# Patient Record
Sex: Male | Born: 1975 | Race: White | Hispanic: No | Marital: Single | Smoking: Current every day smoker
Health system: Southern US, Community
[De-identification: ages and names within clinical notes are randomized; demographics above are authoritative.]

## PROBLEM LIST (undated history)

## (undated) DIAGNOSIS — H6690 Otitis media, unspecified, unspecified ear: Secondary | ICD-10-CM

---

## 2002-11-29 ENCOUNTER — Emergency Department (HOSPITAL_COMMUNITY): Admission: EM | Admit: 2002-11-29 | Discharge: 2002-11-29 | Payer: Self-pay

## 2002-12-08 ENCOUNTER — Emergency Department (HOSPITAL_COMMUNITY): Admission: EM | Admit: 2002-12-08 | Discharge: 2002-12-08 | Payer: Self-pay | Admitting: Emergency Medicine

## 2003-01-05 ENCOUNTER — Emergency Department (HOSPITAL_COMMUNITY): Admission: EM | Admit: 2003-01-05 | Discharge: 2003-01-06 | Payer: Self-pay | Admitting: Emergency Medicine

## 2003-01-21 ENCOUNTER — Emergency Department (HOSPITAL_COMMUNITY): Admission: EM | Admit: 2003-01-21 | Discharge: 2003-01-21 | Payer: Self-pay | Admitting: Emergency Medicine

## 2003-01-21 ENCOUNTER — Encounter: Payer: Self-pay | Admitting: Emergency Medicine

## 2003-02-08 ENCOUNTER — Emergency Department (HOSPITAL_COMMUNITY): Admission: EM | Admit: 2003-02-08 | Discharge: 2003-02-09 | Payer: Self-pay | Admitting: Emergency Medicine

## 2003-02-11 ENCOUNTER — Emergency Department (HOSPITAL_COMMUNITY): Admission: EM | Admit: 2003-02-11 | Discharge: 2003-02-11 | Payer: Self-pay | Admitting: Emergency Medicine

## 2003-02-22 ENCOUNTER — Emergency Department (HOSPITAL_COMMUNITY): Admission: EM | Admit: 2003-02-22 | Discharge: 2003-02-22 | Payer: Self-pay | Admitting: Emergency Medicine

## 2003-03-23 ENCOUNTER — Emergency Department (HOSPITAL_COMMUNITY): Admission: EM | Admit: 2003-03-23 | Discharge: 2003-03-23 | Payer: Self-pay | Admitting: *Deleted

## 2003-03-23 ENCOUNTER — Encounter: Payer: Self-pay | Admitting: *Deleted

## 2003-04-06 ENCOUNTER — Encounter: Payer: Self-pay | Admitting: Emergency Medicine

## 2003-04-06 ENCOUNTER — Emergency Department (HOSPITAL_COMMUNITY): Admission: EM | Admit: 2003-04-06 | Discharge: 2003-04-07 | Payer: Self-pay | Admitting: Emergency Medicine

## 2003-04-15 ENCOUNTER — Emergency Department (HOSPITAL_COMMUNITY): Admission: EM | Admit: 2003-04-15 | Discharge: 2003-04-15 | Payer: Self-pay | Admitting: Emergency Medicine

## 2003-05-07 ENCOUNTER — Emergency Department (HOSPITAL_COMMUNITY): Admission: EM | Admit: 2003-05-07 | Discharge: 2003-05-08 | Payer: Self-pay | Admitting: Emergency Medicine

## 2003-05-31 ENCOUNTER — Emergency Department (HOSPITAL_COMMUNITY): Admission: EM | Admit: 2003-05-31 | Discharge: 2003-06-01 | Payer: Self-pay | Admitting: Emergency Medicine

## 2003-06-05 ENCOUNTER — Emergency Department (HOSPITAL_COMMUNITY): Admission: EM | Admit: 2003-06-05 | Discharge: 2003-06-05 | Payer: Self-pay | Admitting: Emergency Medicine

## 2003-06-07 ENCOUNTER — Emergency Department (HOSPITAL_COMMUNITY): Admission: EM | Admit: 2003-06-07 | Discharge: 2003-06-07 | Payer: Self-pay | Admitting: Emergency Medicine

## 2003-06-09 ENCOUNTER — Emergency Department (HOSPITAL_COMMUNITY): Admission: EM | Admit: 2003-06-09 | Discharge: 2003-06-09 | Payer: Self-pay | Admitting: Emergency Medicine

## 2003-06-10 ENCOUNTER — Emergency Department (HOSPITAL_COMMUNITY): Admission: EM | Admit: 2003-06-10 | Discharge: 2003-06-11 | Payer: Self-pay | Admitting: Emergency Medicine

## 2003-07-05 ENCOUNTER — Emergency Department (HOSPITAL_COMMUNITY): Admission: EM | Admit: 2003-07-05 | Discharge: 2003-07-05 | Payer: Self-pay | Admitting: *Deleted

## 2003-08-16 ENCOUNTER — Emergency Department (HOSPITAL_COMMUNITY): Admission: EM | Admit: 2003-08-16 | Discharge: 2003-08-16 | Payer: Self-pay | Admitting: Emergency Medicine

## 2003-09-04 ENCOUNTER — Emergency Department (HOSPITAL_COMMUNITY): Admission: EM | Admit: 2003-09-04 | Discharge: 2003-09-04 | Payer: Self-pay | Admitting: Emergency Medicine

## 2003-09-29 ENCOUNTER — Emergency Department (HOSPITAL_COMMUNITY): Admission: EM | Admit: 2003-09-29 | Discharge: 2003-09-29 | Payer: Self-pay | Admitting: Emergency Medicine

## 2003-10-07 ENCOUNTER — Emergency Department (HOSPITAL_COMMUNITY): Admission: EM | Admit: 2003-10-07 | Discharge: 2003-10-07 | Payer: Self-pay | Admitting: Emergency Medicine

## 2003-12-08 ENCOUNTER — Emergency Department (HOSPITAL_COMMUNITY): Admission: EM | Admit: 2003-12-08 | Discharge: 2003-12-08 | Payer: Self-pay | Admitting: Emergency Medicine

## 2004-02-06 ENCOUNTER — Emergency Department (HOSPITAL_COMMUNITY): Admission: EM | Admit: 2004-02-06 | Discharge: 2004-02-06 | Payer: Self-pay | Admitting: Emergency Medicine

## 2004-02-29 ENCOUNTER — Emergency Department (HOSPITAL_COMMUNITY): Admission: EM | Admit: 2004-02-29 | Discharge: 2004-03-01 | Payer: Self-pay | Admitting: Emergency Medicine

## 2004-03-01 ENCOUNTER — Emergency Department (HOSPITAL_COMMUNITY): Admission: EM | Admit: 2004-03-01 | Discharge: 2004-03-02 | Payer: Self-pay | Admitting: Emergency Medicine

## 2004-03-03 ENCOUNTER — Emergency Department (HOSPITAL_COMMUNITY): Admission: EM | Admit: 2004-03-03 | Discharge: 2004-03-04 | Payer: Self-pay | Admitting: *Deleted

## 2004-05-13 ENCOUNTER — Emergency Department (HOSPITAL_COMMUNITY): Admission: EM | Admit: 2004-05-13 | Discharge: 2004-05-14 | Payer: Self-pay | Admitting: Emergency Medicine

## 2004-10-20 ENCOUNTER — Emergency Department (HOSPITAL_COMMUNITY): Admission: EM | Admit: 2004-10-20 | Discharge: 2004-10-20 | Payer: Self-pay | Admitting: Emergency Medicine

## 2004-10-26 ENCOUNTER — Emergency Department (HOSPITAL_COMMUNITY): Admission: EM | Admit: 2004-10-26 | Discharge: 2004-10-26 | Payer: Self-pay | Admitting: Emergency Medicine

## 2004-12-04 ENCOUNTER — Emergency Department (HOSPITAL_COMMUNITY): Admission: EM | Admit: 2004-12-04 | Discharge: 2004-12-04 | Payer: Self-pay | Admitting: Emergency Medicine

## 2004-12-15 ENCOUNTER — Emergency Department (HOSPITAL_COMMUNITY): Admission: EM | Admit: 2004-12-15 | Discharge: 2004-12-15 | Payer: Self-pay | Admitting: Emergency Medicine

## 2005-04-21 ENCOUNTER — Emergency Department (HOSPITAL_COMMUNITY): Admission: EM | Admit: 2005-04-21 | Discharge: 2005-04-22 | Payer: Self-pay | Admitting: Emergency Medicine

## 2005-09-06 ENCOUNTER — Emergency Department (HOSPITAL_COMMUNITY): Admission: EM | Admit: 2005-09-06 | Discharge: 2005-09-06 | Payer: Self-pay | Admitting: Emergency Medicine

## 2005-09-11 ENCOUNTER — Emergency Department (HOSPITAL_COMMUNITY): Admission: EM | Admit: 2005-09-11 | Discharge: 2005-09-12 | Payer: Self-pay | Admitting: Emergency Medicine

## 2005-09-22 ENCOUNTER — Emergency Department (HOSPITAL_COMMUNITY): Admission: EM | Admit: 2005-09-22 | Discharge: 2005-09-22 | Payer: Self-pay | Admitting: Emergency Medicine

## 2005-10-16 ENCOUNTER — Emergency Department (HOSPITAL_COMMUNITY): Admission: EM | Admit: 2005-10-16 | Discharge: 2005-10-16 | Payer: Self-pay | Admitting: Emergency Medicine

## 2005-10-24 ENCOUNTER — Emergency Department: Payer: Self-pay | Admitting: Emergency Medicine

## 2005-10-29 ENCOUNTER — Emergency Department: Payer: Self-pay | Admitting: Emergency Medicine

## 2005-11-02 ENCOUNTER — Emergency Department (HOSPITAL_COMMUNITY): Admission: EM | Admit: 2005-11-02 | Discharge: 2005-11-02 | Payer: Self-pay | Admitting: Emergency Medicine

## 2005-11-09 ENCOUNTER — Emergency Department (HOSPITAL_COMMUNITY): Admission: EM | Admit: 2005-11-09 | Discharge: 2005-11-10 | Payer: Self-pay | Admitting: Emergency Medicine

## 2005-11-10 ENCOUNTER — Emergency Department: Payer: Self-pay | Admitting: Emergency Medicine

## 2005-11-29 ENCOUNTER — Emergency Department: Payer: Self-pay | Admitting: Emergency Medicine

## 2005-12-10 ENCOUNTER — Emergency Department: Payer: Self-pay | Admitting: Emergency Medicine

## 2007-06-25 IMAGING — CT CT ABDOMEN W/O CM
1 of 3 series · 15 of 36 positions shown, 19 images · non-contrast
Comparison: 09/22/2005.

CLINICAL DATA: 29-year-old, right flank pain. 
 ABDOMEN CT WITHOUT CONTRAST (CT UROGRAM):
TECHNIQUE: Multidetector CT imaging of the abdomen was performed following the standard protocol without IV contrast.
TECHNIQUE: Multidetector CT imaging of the pelvis was performed following the standard protocol without IV contrast. 
 No ureteral calculi or bladder calculi.  The rectum, sigmoid colon and visualized small bowel loops appear normal.  The appendix is visualized and is normal.  No significant bony findings.

[Series 2: renal stone · axial · 0.70mm/px · z∈[-444,-19]mm · 15 of 94 slices shown, 19 images]
[im 7/94  soft-tissue]
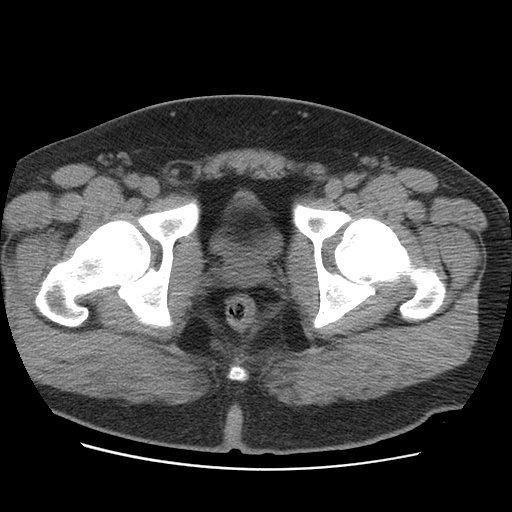
[im 7/94  bone]
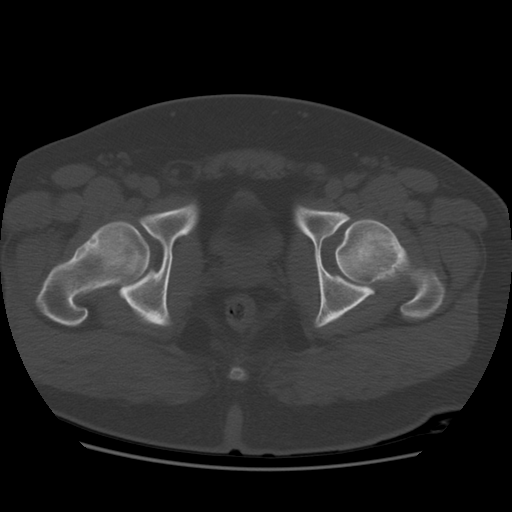
[im 13/94  soft-tissue]
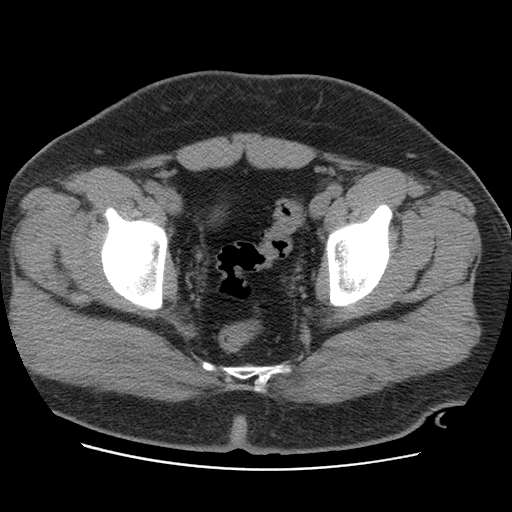
[im 19/94  soft-tissue]
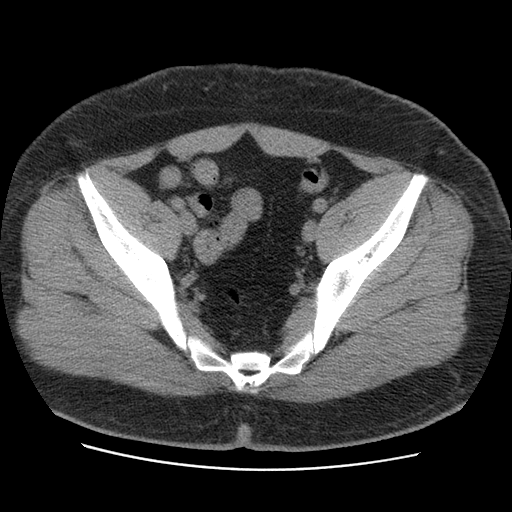
[im 28/94  soft-tissue]
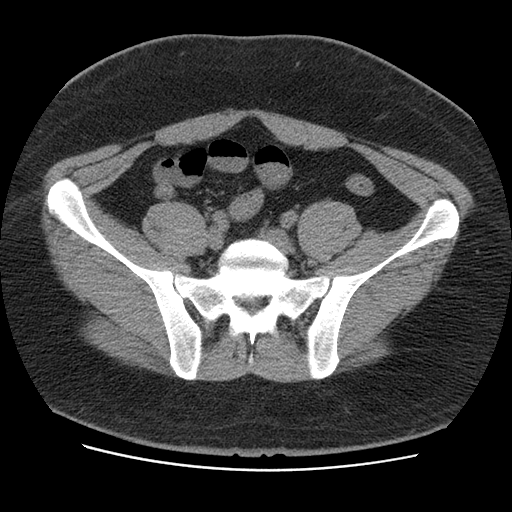
[im 34/94  soft-tissue]
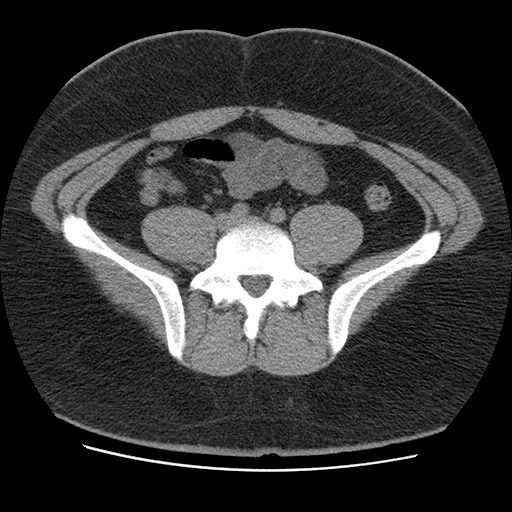
[im 40/94  soft-tissue]
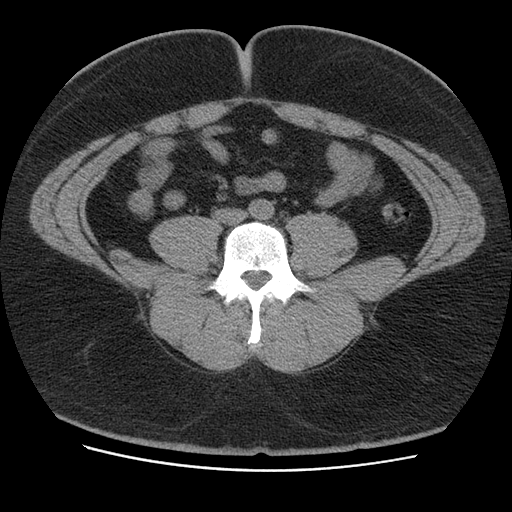
[im 49/94  soft-tissue]
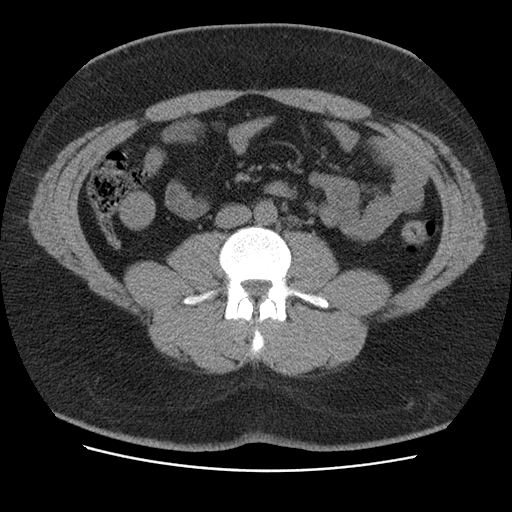
[im 55/94  soft-tissue]
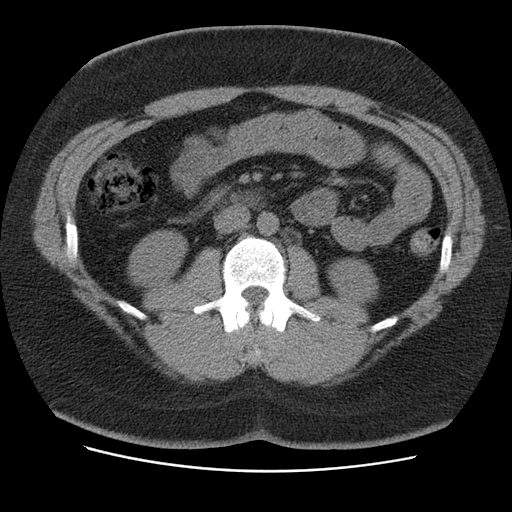
[im 61/94  soft-tissue]
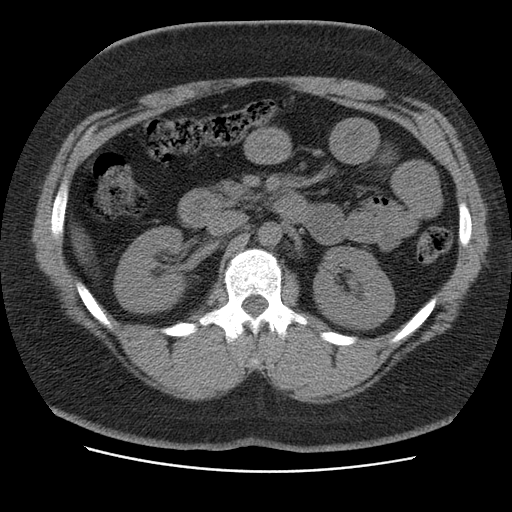
[im 61/94  bone]
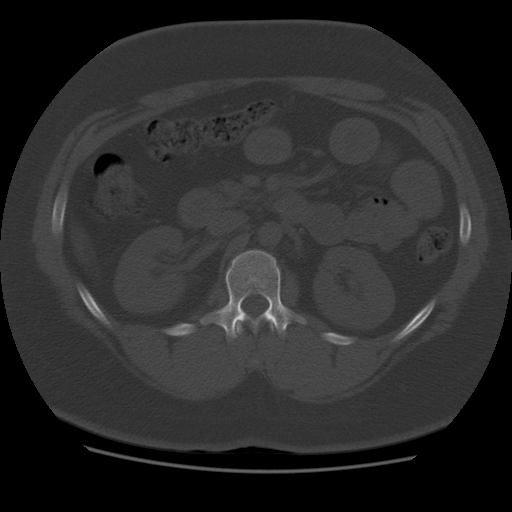
[im 67/94  soft-tissue]
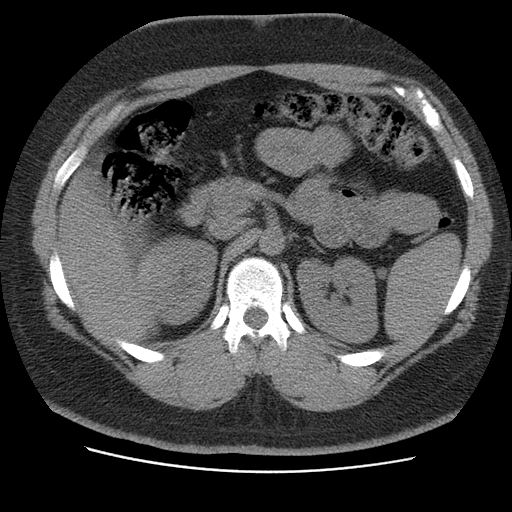
[im 76/94  soft-tissue]
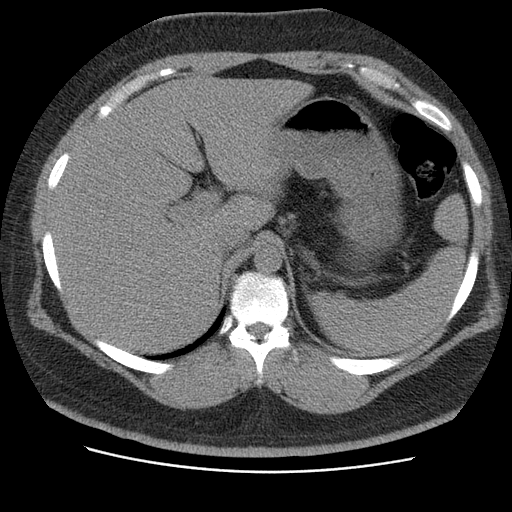
[im 82/94  soft-tissue]
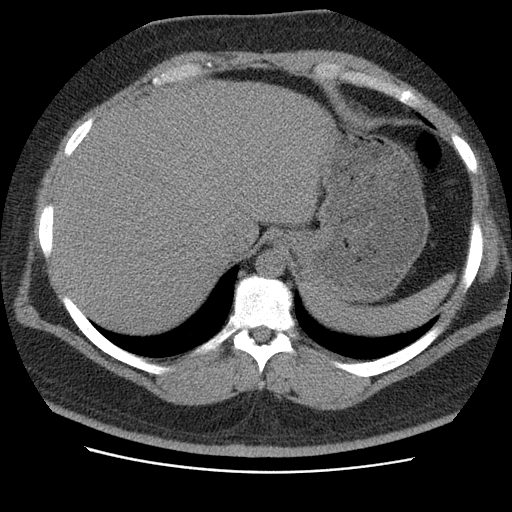
[im 82/94  lung]
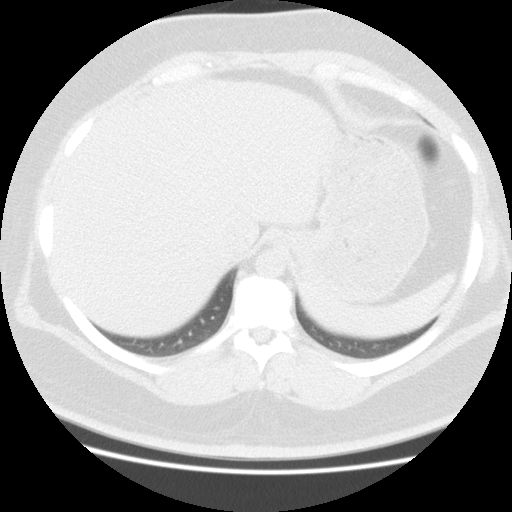
[im 85/94  lung]
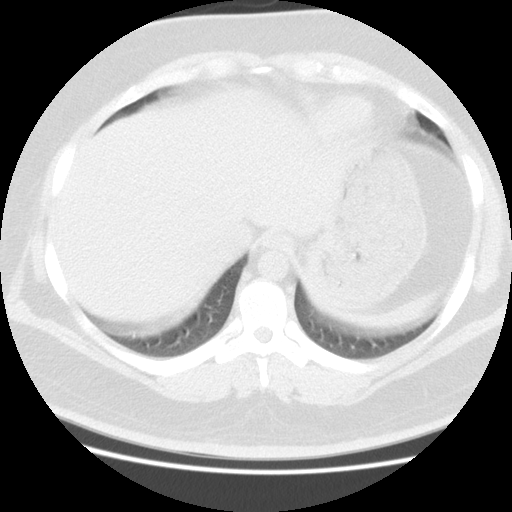
[im 88/94  soft-tissue]
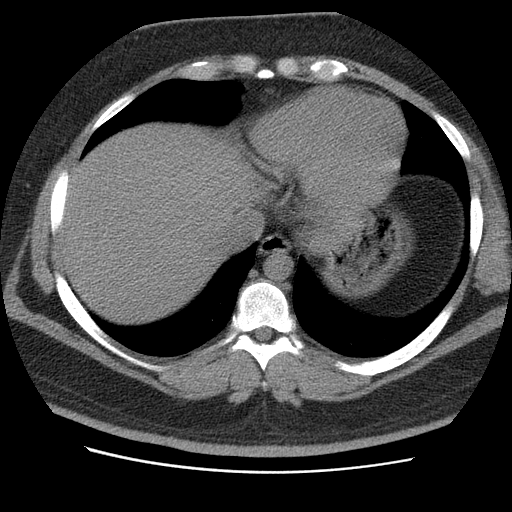
[im 88/94  lung]
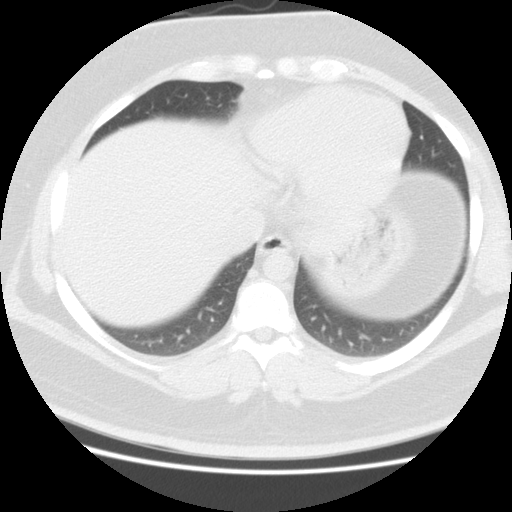
[im 91/94  lung]
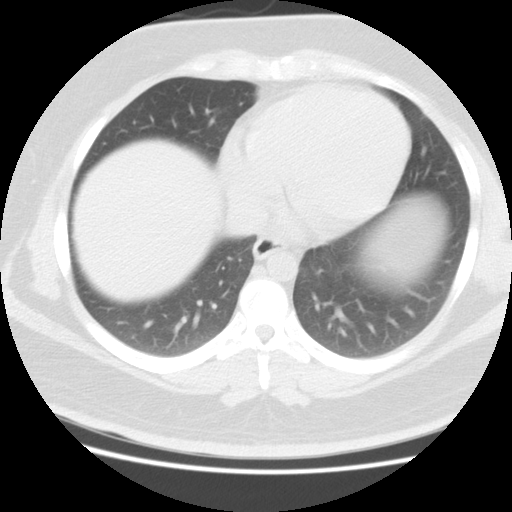

[15 of 36 positions shown; findings below may reference images not displayed]

The lung bases are clear.  
 The unenhanced appearance of the liver and spleen are unremarkable.  Small accessory spleens are again noted.  The pancreas, adrenal glands, and left kidney are normal in appearance.  Right kidney has a single right renal calculus which is stable.  No hydronephrosis or perinephric signs of obstruction.  The right ureter is normal in caliber.  No upper ureteral calculi are seen.  The aorta is normal in caliber.  There are a few small scattered stable fatty retroperitoneal lymph nodes.  Gallbladder is unremarkable.  Stomach, duodenum, small bowel and colon are grossly normal but the study is limited without oral contrast.
IMPRESSION: Stable right renal calculus. No evidence for renal obstructive process or ureteral calculi. 
 PELVIS CT WITHOUT CONTRAST:
IMPRESSION: Unremarkable CT examination of the pelvis.  No ureteral or bladder calculi.

## 2008-01-29 ENCOUNTER — Emergency Department (HOSPITAL_COMMUNITY): Admission: EM | Admit: 2008-01-29 | Discharge: 2008-01-29 | Payer: Self-pay | Admitting: Emergency Medicine

## 2008-02-09 ENCOUNTER — Emergency Department (HOSPITAL_COMMUNITY): Admission: EM | Admit: 2008-02-09 | Discharge: 2008-02-09 | Payer: Self-pay | Admitting: Emergency Medicine

## 2008-03-27 ENCOUNTER — Emergency Department (HOSPITAL_COMMUNITY): Admission: EM | Admit: 2008-03-27 | Discharge: 2008-03-27 | Payer: Self-pay | Admitting: Emergency Medicine

## 2008-04-24 ENCOUNTER — Emergency Department (HOSPITAL_COMMUNITY): Admission: EM | Admit: 2008-04-24 | Discharge: 2008-04-24 | Payer: Self-pay | Admitting: Emergency Medicine

## 2008-06-25 ENCOUNTER — Emergency Department (HOSPITAL_COMMUNITY): Admission: EM | Admit: 2008-06-25 | Discharge: 2008-06-25 | Payer: Self-pay | Admitting: Emergency Medicine

## 2009-10-01 IMAGING — CR DG KNEE COMPLETE 4+V*R*
4 series · 4 of 4 positions shown · non-contrast
Comparison: No priors

CLINICAL DATA: Injury August 2007.  Continued pain and swelling

RIGHT KNEE - COMPLETE 4+ VIEW

[t knee ap right]
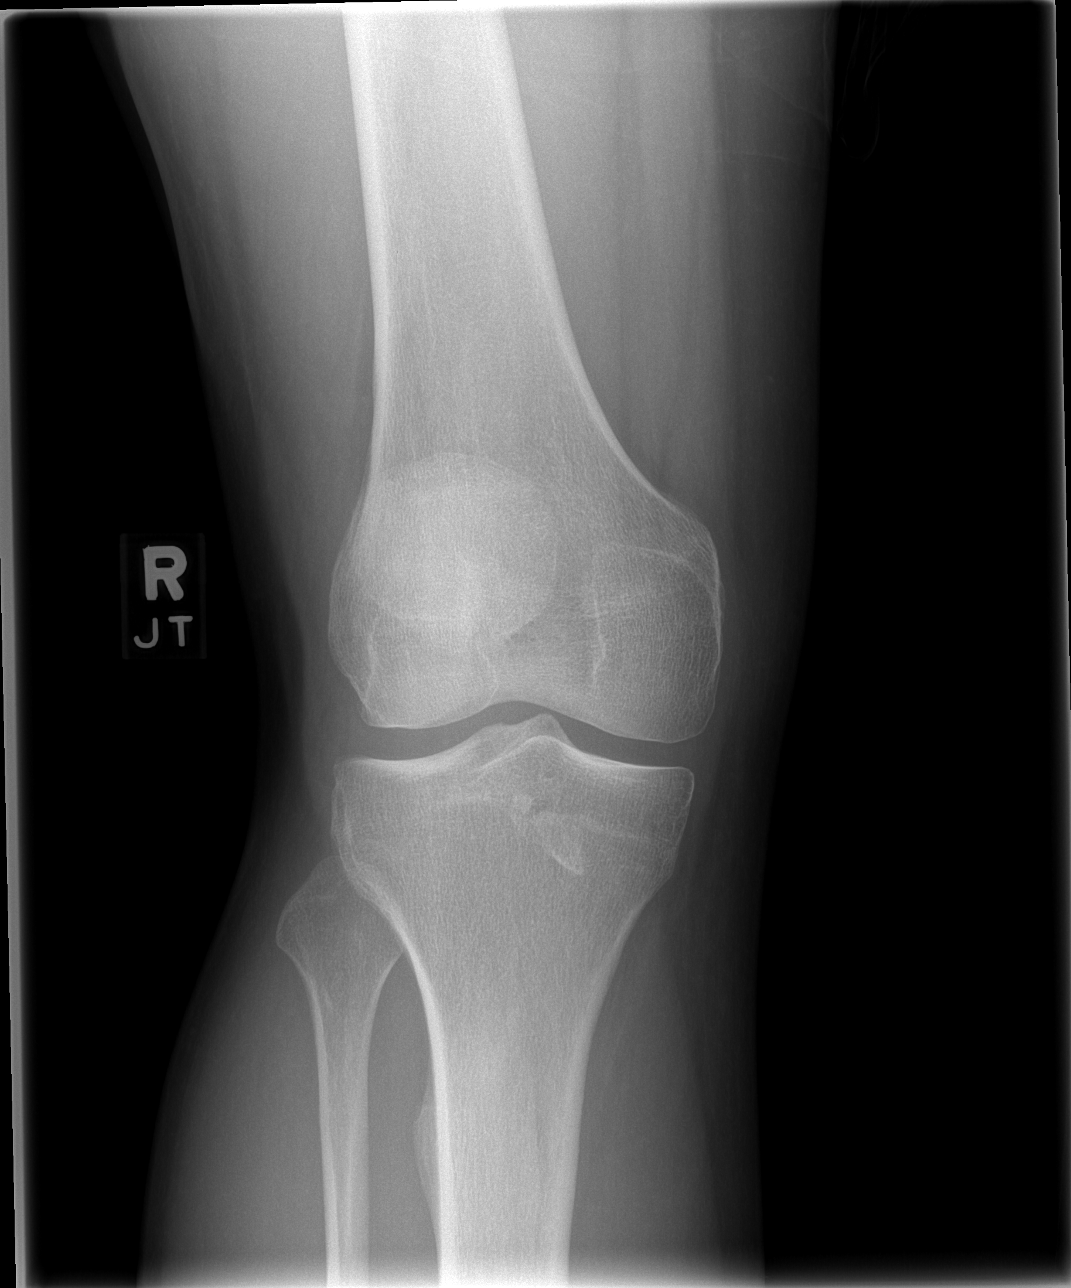

[t knee oblique right (1 of 2)]
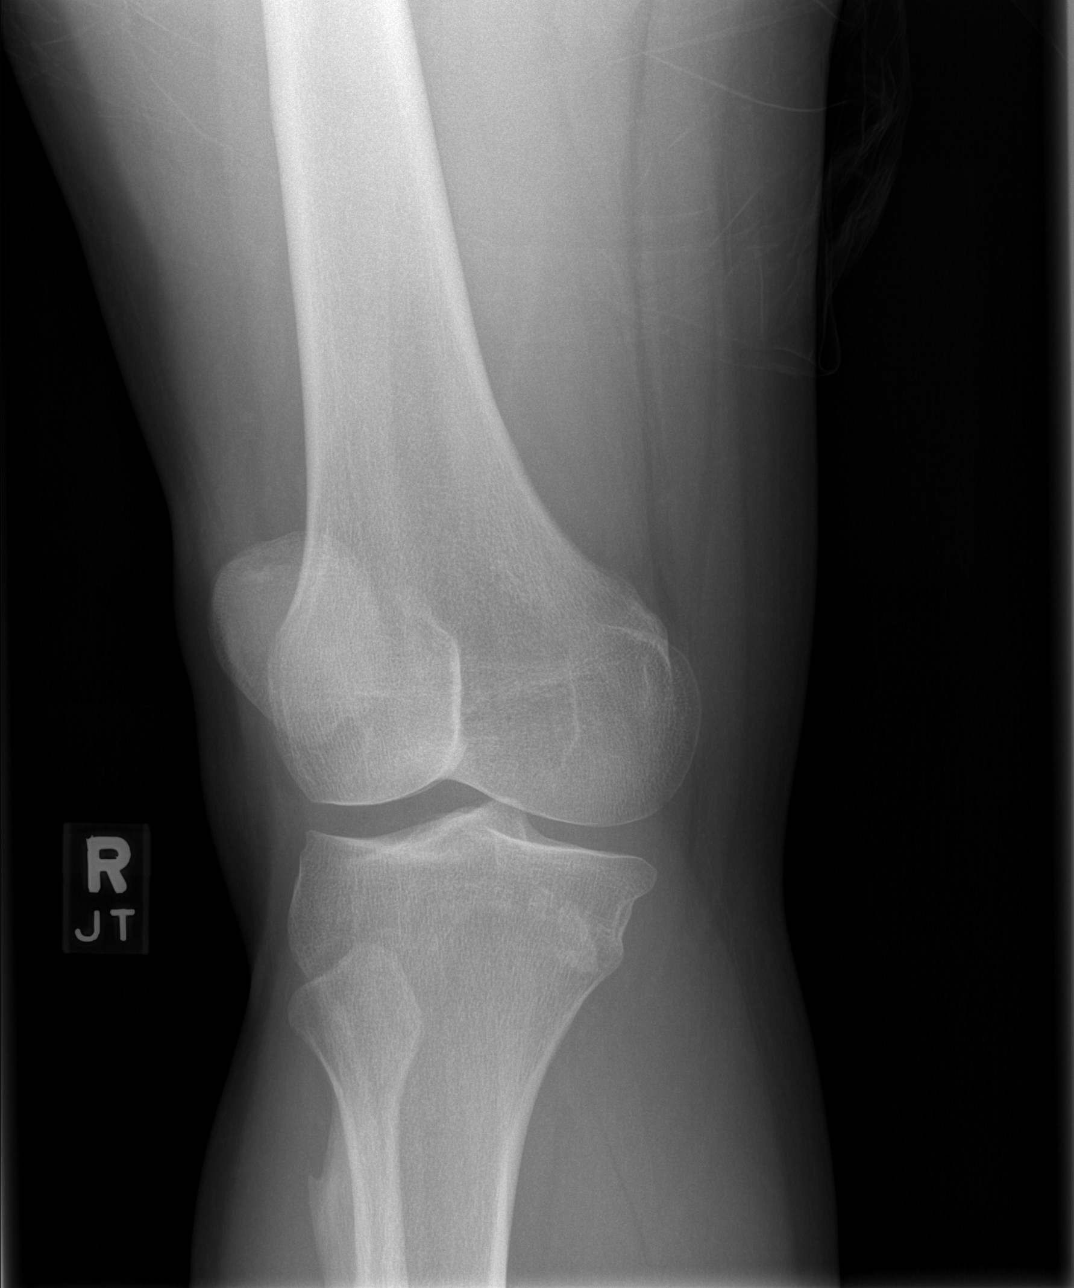

[t knee oblique right (2 of 2)]
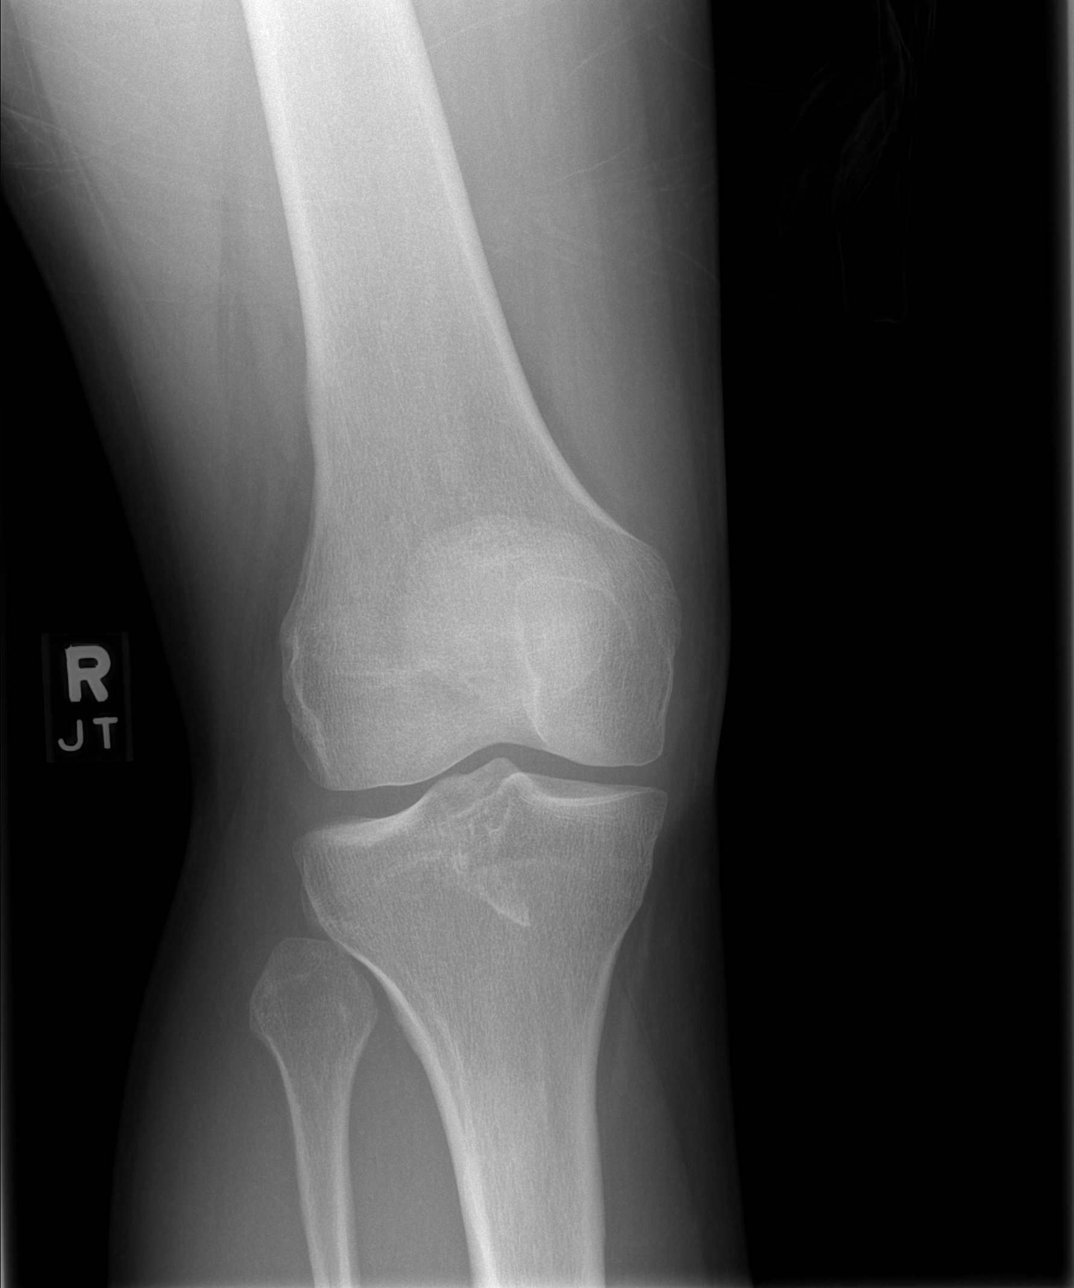

[t knee lat right]
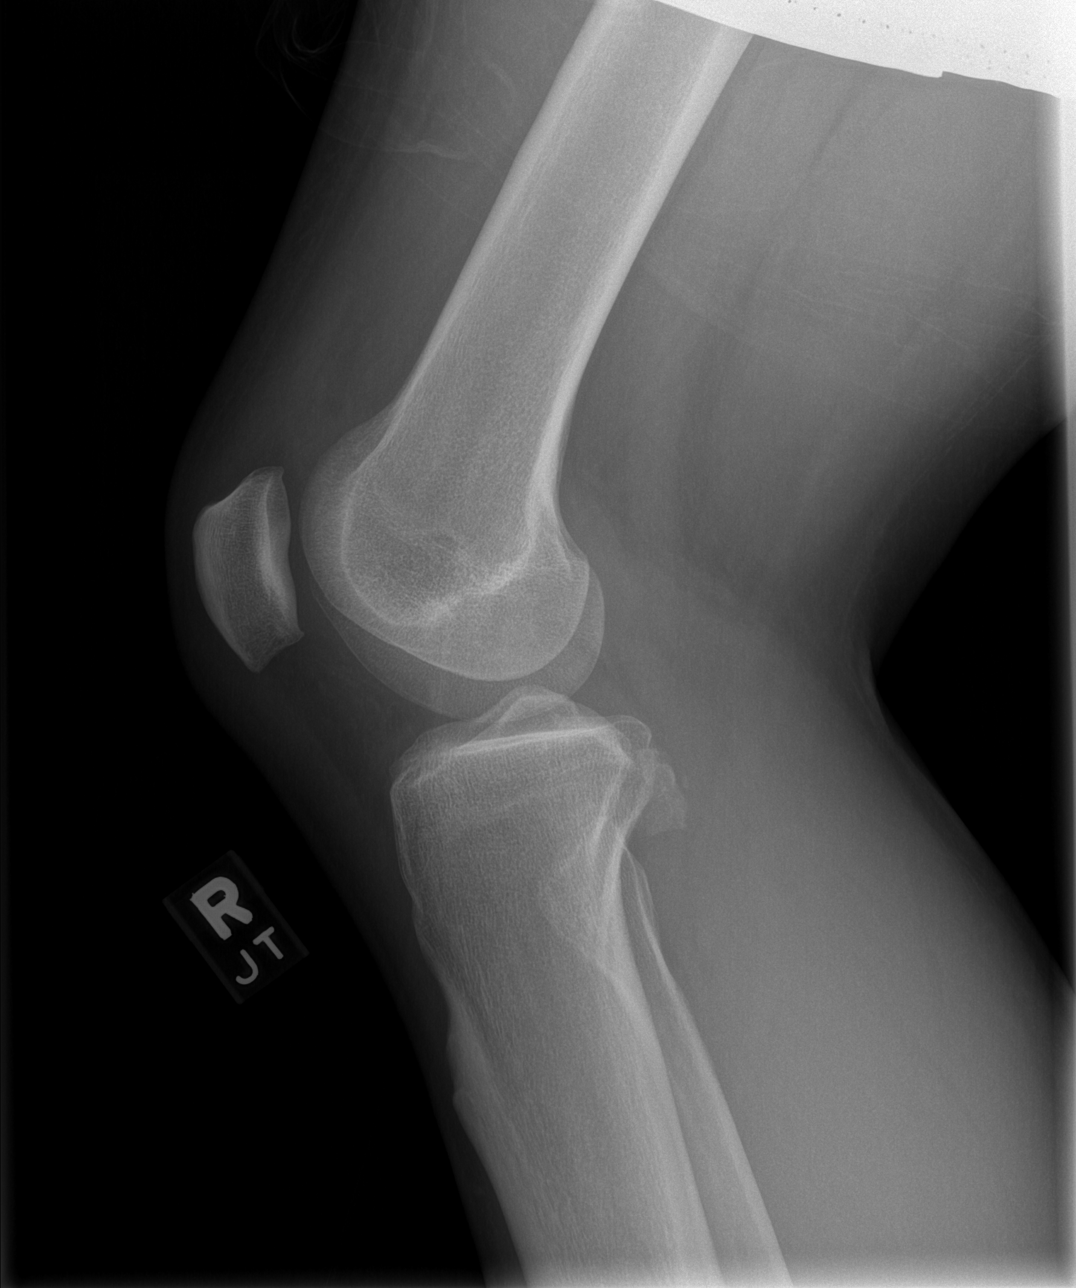

[4 of 4 positions shown; findings below may reference images not displayed]

FINDINGS: There is a bony density projecting posterior to the
proximal tibia, best seen on the lateral view.  On the other views
this projects near the midline.  This is likely an old avulsion
fracture.  It probably emanates off of the articular surface
posteriorly.  The presence of this type of fracture may be
associated with a posterior cruciate ligament injury.  Consider MRI
for further assessment if clinically warranted.

Currently there is no joint effusion or definite acute abnormality.
IMPRESSION: Probable remote avulsion fracture off of the posterior articular
surface of the tibia.  Possible associated posterior cruciate
ligament injury.  Consider MRI for further assessment.

## 2009-11-27 ENCOUNTER — Emergency Department (HOSPITAL_COMMUNITY): Admission: EM | Admit: 2009-11-27 | Discharge: 2009-11-27 | Payer: Self-pay | Admitting: Emergency Medicine

## 2010-01-23 ENCOUNTER — Emergency Department (HOSPITAL_COMMUNITY): Admission: EM | Admit: 2010-01-23 | Discharge: 2010-01-23 | Payer: Self-pay | Admitting: Emergency Medicine

## 2010-07-23 ENCOUNTER — Emergency Department (HOSPITAL_COMMUNITY)
Admission: EM | Admit: 2010-07-23 | Discharge: 2010-07-23 | Payer: Self-pay | Source: Home / Self Care | Admitting: Emergency Medicine

## 2010-12-25 ENCOUNTER — Emergency Department (HOSPITAL_COMMUNITY)
Admission: EM | Admit: 2010-12-25 | Discharge: 2010-12-25 | Disposition: A | Discharge status: Home / Self Care | Payer: Self-pay | Attending: Emergency Medicine | Admitting: Emergency Medicine

## 2010-12-25 DIAGNOSIS — K089 Disorder of teeth and supporting structures, unspecified: Secondary | ICD-10-CM | POA: Insufficient documentation

## 2011-01-14 ENCOUNTER — Emergency Department (HOSPITAL_COMMUNITY): Payer: Self-pay

## 2011-01-14 ENCOUNTER — Emergency Department (HOSPITAL_COMMUNITY)
Admission: EM | Admit: 2011-01-14 | Discharge: 2011-01-14 | Disposition: A | Discharge status: Home / Self Care | Payer: Self-pay | Attending: Emergency Medicine | Admitting: Emergency Medicine

## 2011-01-14 DIAGNOSIS — R109 Unspecified abdominal pain: Secondary | ICD-10-CM | POA: Insufficient documentation

## 2011-01-14 DIAGNOSIS — R112 Nausea with vomiting, unspecified: Secondary | ICD-10-CM | POA: Insufficient documentation

## 2011-01-14 LAB — URINALYSIS, ROUTINE W REFLEX MICROSCOPIC
Ketones, ur: NEGATIVE mg/dL
Nitrite: NEGATIVE
Protein, ur: NEGATIVE mg/dL
Specific Gravity, Urine: 1.023 (ref 1.005–1.030)

## 2011-01-14 LAB — DIFFERENTIAL
Basophils Relative: 1 % (ref 0–1)
Eosinophils Absolute: 0.1 10*3/uL (ref 0.0–0.7)
Eosinophils Relative: 1 % (ref 0–5)
Monocytes Absolute: 0.6 10*3/uL (ref 0.1–1.0)
Monocytes Relative: 8 % (ref 3–12)
Neutrophils Relative %: 60 % (ref 43–77)

## 2011-01-14 LAB — URINE MICROSCOPIC-ADD ON

## 2011-01-14 LAB — BASIC METABOLIC PANEL
BUN: 13 mg/dL (ref 6–23)
Chloride: 100 mEq/L (ref 96–112)
Creatinine, Ser: 0.85 mg/dL (ref 0.50–1.35)
GFR calc Af Amer: >60 mL/min (ref >60)
Glucose, Bld: 103 mg/dL — ABNORMAL HIGH (ref 70–99)
Sodium: 138 mEq/L (ref 135–145)

## 2011-01-14 LAB — CBC
MCHC: 35.8 g/dL (ref 30.0–36.0)
MCV: 84.1 fL (ref 78.0–100.0)
WBC: 7.5 10*3/uL (ref 4.0–10.5)

## 2011-01-15 LAB — URINE CULTURE
Colony Count: 45000
Culture  Setup Time: 201207180112

## 2011-03-31 LAB — URINALYSIS, ROUTINE W REFLEX MICROSCOPIC
Ketones, ur: 15 — AB
Leukocytes, UA: NEGATIVE
Leukocytes, UA: NEGATIVE
Nitrite: NEGATIVE
Nitrite: NEGATIVE
Protein, ur: 30 — AB
Specific Gravity, Urine: 1.023
pH: 5.5
pH: 5.5

## 2011-03-31 LAB — DIFFERENTIAL
Eosinophils Absolute: 0.1
Lymphocytes Relative: 23
Lymphs Abs: 1.6
Neutro Abs: 4.6
Neutrophils Relative %: 68

## 2011-03-31 LAB — URINE MICROSCOPIC-ADD ON

## 2011-03-31 LAB — POCT I-STAT, CHEM 8
BUN: 9
Creatinine, Ser: 0.9
Hemoglobin: 17
Potassium: 4.1
Sodium: 141

## 2011-03-31 LAB — CBC
Platelets: 207
WBC: 6.9

## 2011-03-31 LAB — GC/CHLAMYDIA PROBE AMP, URINE: Chlamydia, Swab/Urine, PCR: NEGATIVE

## 2011-10-05 ENCOUNTER — Encounter (HOSPITAL_COMMUNITY): Payer: Self-pay | Admitting: *Deleted

## 2011-10-05 ENCOUNTER — Emergency Department (HOSPITAL_COMMUNITY)
Admission: EM | Admit: 2011-10-05 | Discharge: 2011-10-06 | Disposition: A | Discharge status: Home / Self Care | Payer: Self-pay | Attending: Emergency Medicine | Admitting: Emergency Medicine

## 2011-10-05 DIAGNOSIS — F17200 Nicotine dependence, unspecified, uncomplicated: Secondary | ICD-10-CM | POA: Insufficient documentation

## 2011-10-05 DIAGNOSIS — K029 Dental caries, unspecified: Secondary | ICD-10-CM | POA: Insufficient documentation

## 2011-10-05 DIAGNOSIS — K0889 Other specified disorders of teeth and supporting structures: Secondary | ICD-10-CM

## 2011-10-05 MED ORDER — IBUPROFEN 800 MG PO TABS
800.0000 mg | ORAL_TABLET | Freq: Once | ORAL | Status: AC
  Administered 2011-10-06: 800 mg via ORAL
  Filled 2011-10-05: qty 1

## 2011-10-05 MED ORDER — PENICILLIN V POTASSIUM 500 MG PO TABS: 500.0000 mg | ORAL_TABLET | Freq: Four times a day (QID) | ORAL | Status: AC

## 2011-10-05 MED ORDER — IBUPROFEN 800 MG PO TABS: 800.0000 mg | ORAL_TABLET | Freq: Three times a day (TID) | ORAL | Status: AC

## 2011-10-05 NOTE — ED Provider Notes (Signed)
History     CSN: [id number]  Arrival date & time 10/05/11  2322   First MD Initiated Contact with Patient 10/05/11 2332      Chief Complaint  Patient presents with  . Dental Pain    (Consider location/radiation/quality/duration/timing/severity/associated sxs/prior treatment) HPI Onset tonight around 6 PM. Patient was eating dinner felt a sharp pain in his left upper first molar. He has been persistent since onset. No difficulty swallowing or breathing. No facial swelling. No difficulty opening his mouth. No fevers or chills. No nausea vomiting. Patient tells me he is deathly afraid of seeing a dentist and is aware that he has very bad teeth. He declines dental injection. He declines pain medications and is only requesting antibiotics. Pain is sharp in quality and not radiating. No ear pain. No headaches.pain is severe.   History reviewed. No pertinent past medical history.  History reviewed. No pertinent past surgical history.  History reviewed. No pertinent family history.  History  Substance Use Topics  . Smoking status: Current Everyday Smoker  . Smokeless tobacco: Not on file  . Alcohol Use: No      Review of Systems  Constitutional: Negative for fever and chills.  HENT: Positive for dental problem. Negative for sore throat, drooling, mouth sores, trouble swallowing, neck pain, neck stiffness, voice change and sinus pressure.   Eyes: Negative for pain.  Respiratory: Negative for shortness of breath.   Cardiovascular: Negative for chest pain.  Gastrointestinal: Negative for abdominal pain.  Genitourinary: Negative for dysuria.  Musculoskeletal: Negative for back pain.  Skin: Negative for rash.  Neurological: Negative for headaches.  All other systems reviewed and are negative.    Allergies  Review of patient's allergies indicates no known allergies.  Home Medications  No current outpatient prescriptions on file.  BP 137/81  Pulse 93  Temp(Src) 99.1 F (37.3  C) (Oral)  Resp 20  Ht 5\' 9"  (1.753 m)  Wt 245 lb (111.131 kg)  BMI 36.18 kg/m2  SpO2 96%  Physical Exam  Constitutional: He is oriented to person, place, and time. He appears well-developed and well-nourished.  HENT:  Head: Normocephalic and atraumatic.       No trismus. Posterior pharynx is clear. Very poor dentition with multiple caries. Tenderness over left upper first molar without evidence of acute trauma. No gingival tenderness or swelling. No associated facial swelling or erythema.  Eyes: Conjunctivae and EOM are normal. Pupils are equal, round, and reactive to light.  Neck: Trachea normal. Neck supple.  Cardiovascular: Normal rate, regular rhythm, S1 normal, S2 normal and normal pulses.     No systolic murmur is present   No diastolic murmur is present  Pulses:      Radial pulses are 2+ on the right side, and 2+ on the left side.  Pulmonary/Chest: No respiratory distress. He has no rhonchi.  Abdominal: Normal appearance. There is no CVA tenderness and negative Murphy's sign.  Musculoskeletal:       BLE:s Calves nontender, no cords or erythema, negative Homans sign  Neurological: He is alert and oriented to person, place, and time. He has normal strength. No sensory deficit. GCS eye subscore is 4. GCS verbal subscore is 5. GCS motor subscore is 6.  Skin: Skin is warm and dry. No rash noted. He is not diaphoretic.  Psychiatric: His speech is normal.       Cooperative and appropriate    ED Course  Procedures (including critical care time)  Patient declines pain medications requesting antibiotics  only. Dental block declined.   MDM   Dental tenderness possible abscess without evidence of acute trauma or pulp exposure.  antibiotics and ibuprofen prescriptions provided. Dental referrals provided. Reliable historian states understanding dental infection precautions.        Sunnie Nielsen, MD 10/05/11 [phone]

## 2011-10-05 NOTE — Discharge Instructions (Signed)
Dental Pain A tooth ache may be caused by cavities (tooth decay). Cavities expose the nerve of the tooth to air and hot or cold temperatures. It may come from an infection or abscess (also called a boil or furuncle) around your tooth. It is also often caused by dental caries (tooth decay). This causes the pain you are having. DIAGNOSIS  Your caregiver can diagnose this problem by exam. TREATMENT   If caused by an infection, it may be treated with medications which kill germs (antibiotics) and pain medications as prescribed by your caregiver. Take medications as directed.   Only take over-the-counter or prescription medicines for pain, discomfort, or fever as directed by your caregiver.   Whether the tooth ache today is caused by infection or dental disease, you should see your dentist as soon as possible for further care.  SEEK MEDICAL CARE IF: The exam and treatment you received today has been provided on an emergency basis only. This is not a substitute for complete medical or dental care. If your problem worsens or new problems (symptoms) appear, and you are unable to meet with your dentist, call or return to this location. SEEK IMMEDIATE MEDICAL CARE IF:   You have a fever.   You develop redness and swelling of your face, jaw, or neck.   You are unable to open your mouth.   You have severe pain uncontrolled by pain medicine.  MAKE SURE YOU:   Understand these instructions.   Will watch your condition.   Will get help right away if you are not doing well or get worse.     Dental Problems  Patients with Medicaid: [facility] [phone]                                           [phone] [employer] [address] Phone:  [phone]                                                  Phone:  [phone]  If unable to pay or uninsured, contact:  [facility] or [facility]. to become qualified for the adult dental  clinic.    No Primary Care Doctor Call [facility]  [phone] Other agencies that provide inexpensive medical care    [facility]  [phone]    [facility]  [phone]    [facility]  [phone]    [facility]  [phone]    [facility]  [phone]    [facility]  [phone]   [facility] Resources  [facility]     [employer]                          [facility] Dept. [address] [place]                       [address]      [address]  [address]  Cristobal Goldmann Phone:  [phone]                                   Phone:  [phone]                 Phone:  [phone]

## 2011-10-14 ENCOUNTER — Emergency Department: Payer: Self-pay | Admitting: *Deleted

## 2011-10-14 LAB — URINALYSIS, COMPLETE
Glucose,UR: NEGATIVE mg/dL (ref 0–75)
Leukocyte Esterase: NEGATIVE
Ph: 5 (ref 4.5–8.0)
RBC,UR: 10 /HPF (ref 0–5)
Squamous Epithelial: NONE SEEN

## 2011-10-14 LAB — CBC
HCT: 49 % (ref 40.0–52.0)
HGB: 16.6 g/dL (ref 13.0–18.0)
MCV: 87 fL (ref 80–100)
Platelet: 191 10*3/uL (ref 150–440)
RDW: 14 % (ref 11.5–14.5)
WBC: 9.6 10*3/uL (ref 3.8–10.6)

## 2011-10-14 LAB — BASIC METABOLIC PANEL
Anion Gap: 11 (ref 7–16)
BUN: 13 mg/dL (ref 7–18)
Calcium, Total: 9.1 mg/dL (ref 8.5–10.1)
Co2: 24 mmol/L (ref 21–32)
EGFR (Non-African Amer.): >60
Glucose: 116 mg/dL — ABNORMAL HIGH (ref 65–99)

## 2011-10-15 LAB — URINE CULTURE

## 2011-11-06 ENCOUNTER — Emergency Department: Payer: Self-pay | Admitting: Emergency Medicine

## 2011-11-06 LAB — CBC
HGB: 17.1 g/dL (ref 13.0–18.0)
RBC: 5.83 10*6/uL (ref 4.40–5.90)
RDW: 14.2 % (ref 11.5–14.5)
WBC: 8.9 10*3/uL (ref 3.8–10.6)

## 2011-11-06 LAB — URINALYSIS, COMPLETE
Bilirubin,UR: NEGATIVE
WBC UR: 1 /HPF (ref 0–5)

## 2011-11-06 LAB — BASIC METABOLIC PANEL
Anion Gap: 8 (ref 7–16)
BUN: 12 mg/dL (ref 7–18)
Calcium, Total: 9 mg/dL (ref 8.5–10.1)
Co2: 24 mmol/L (ref 21–32)
Glucose: 100 mg/dL — ABNORMAL HIGH (ref 65–99)
Osmolality: 279 (ref 275–301)
Potassium: 3.7 mmol/L (ref 3.5–5.1)
Sodium: 140 mmol/L (ref 136–145)

## 2011-11-23 ENCOUNTER — Emergency Department (HOSPITAL_COMMUNITY)
Admission: EM | Admit: 2011-11-23 | Discharge: 2011-11-24 | Disposition: A | Discharge status: Home / Self Care | Payer: Self-pay | Attending: Emergency Medicine | Admitting: Emergency Medicine

## 2011-11-23 ENCOUNTER — Encounter (HOSPITAL_COMMUNITY): Payer: Self-pay

## 2011-11-23 DIAGNOSIS — R1012 Left upper quadrant pain: Secondary | ICD-10-CM | POA: Insufficient documentation

## 2011-11-23 DIAGNOSIS — R109 Unspecified abdominal pain: Secondary | ICD-10-CM | POA: Insufficient documentation

## 2011-11-23 DIAGNOSIS — R110 Nausea: Secondary | ICD-10-CM | POA: Insufficient documentation

## 2011-11-23 DIAGNOSIS — F17200 Nicotine dependence, unspecified, uncomplicated: Secondary | ICD-10-CM | POA: Insufficient documentation

## 2011-11-23 HISTORY — DX: Otitis media, unspecified, unspecified ear: H66.90

## 2011-11-23 NOTE — ED Notes (Signed)
Pt presents with no acute distress- c/o of LUQ pain only. NV before arrival- denies fever and  diarrhea

## 2011-11-24 ENCOUNTER — Emergency Department (HOSPITAL_COMMUNITY): Payer: Self-pay

## 2011-11-24 LAB — URINALYSIS, ROUTINE W REFLEX MICROSCOPIC
Bilirubin Urine: NEGATIVE
Ketones, ur: NEGATIVE mg/dL
Nitrite: NEGATIVE
Protein, ur: NEGATIVE mg/dL
Specific Gravity, Urine: 1.03 (ref 1.005–1.030)
Urobilinogen, UA: 2 mg/dL — ABNORMAL HIGH (ref 0.0–1.0)

## 2011-11-24 LAB — URINE MICROSCOPIC-ADD ON

## 2011-11-24 LAB — COMPREHENSIVE METABOLIC PANEL
Albumin: 4.2 g/dL (ref 3.5–5.2)
Alkaline Phosphatase: 64 U/L (ref 39–117)
BUN: 11 mg/dL (ref 6–23)
Chloride: 104 mEq/L (ref 96–112)
Potassium: 3.7 mEq/L (ref 3.5–5.1)
Total Bilirubin: 0.3 mg/dL (ref 0.3–1.2)

## 2011-11-24 LAB — CBC
MCH: 29.7 pg (ref 26.0–34.0)
MCV: 86 fL (ref 78.0–100.0)
Platelets: 205 10*3/uL (ref 150–400)
RDW: 14 % (ref 11.5–15.5)
WBC: 8.6 10*3/uL (ref 4.0–10.5)

## 2011-11-24 MED ORDER — HYDROMORPHONE HCL PF 1 MG/ML IJ SOLN
1.0000 mg | Freq: Once | INTRAMUSCULAR | Status: AC
  Administered 2011-11-24: 1 mg via INTRAVENOUS
  Filled 2011-11-24: qty 1

## 2011-11-24 MED ORDER — SODIUM CHLORIDE 0.9 % IV BOLUS (SEPSIS)
1000.0000 mL | Freq: Once | INTRAVENOUS | Status: AC
  Administered 2011-11-24: 1000 mL via INTRAVENOUS

## 2011-11-24 MED ORDER — ONDANSETRON HCL 4 MG/2ML IJ SOLN
4.0000 mg | Freq: Once | INTRAMUSCULAR | Status: AC
  Administered 2011-11-24: 4 mg via INTRAVENOUS
  Filled 2011-11-24: qty 2

## 2011-11-24 MED ORDER — KETOROLAC TROMETHAMINE 30 MG/ML IJ SOLN
30.0000 mg | Freq: Once | INTRAMUSCULAR | Status: DC
  Filled 2011-11-24: qty 1

## 2011-11-24 MED ORDER — NAPROXEN 500 MG PO TABS: 500.0000 mg | ORAL_TABLET | Freq: Two times a day (BID) | ORAL | Status: AC

## 2011-11-24 NOTE — ED Notes (Signed)
Spoke with MD.  No pain meds ordered.

## 2011-11-24 NOTE — ED Notes (Signed)
Pt requesting more pain medicine before discharge

## 2011-11-24 NOTE — Discharge Instructions (Signed)
Your CT scan shows a kidney stone inside your kidney which is not causing your symptoms. There are no other abnormal findings in your CAT scan, your blood work is normal, your urine test does not show infection. Take Naprosyn twice a day for pain, followup with your doctor as needed.  RESOURCE GUIDE  Dental Problems  Patients with Medicaid: [facility] [phone] [address]                                           [phone] [address] Phone:  [phone]                                                  Phone:  [phone]  If unable to pay or uninsured, contact:  [facility] or [facility]. to become qualified for the adult dental clinic.  Chronic Pain Problems Contact [facility]  [phone] Patients need to be referred by their primary care doctor.  Insufficient Money for Medicine Contact [employer] Way:  call "211" or [facility] 5053213428.  No Primary Care Doctor Call [facility]  [phone] Other agencies that provide inexpensive medical care    [facility]  [phone]    [facility]  [phone]    [facility]  [phone]    [facility]  [phone]    [facility]  [phone]    [facility]  [phone]  Psychological Services [facility]  [phone] [facility] Services  [phone] [facility]   [phone] (emergency services [phone])  Substance Abuse Resources [facility]  [phone] [facility] [phone] [facility] [phone] Floydene Flock [phone] Residential & Outpatient Substance Abuse Program  [phone]  Abuse/Neglect [facility] Child Abuse Hotline 249-305-5156 [facility] Child Abuse Hotline 228-152-1110 (After Hours)  Emergency Shelter [facility] [phone]  Maternity Homes Room at the [place] [facility] [place] [phone] [place] Alert Services 9524757152  MRSA Hotline #:   (779)498-0673    [facility] Resources  [facility] [place]     United Way                          [facility] Dept. [address] [place]                       [address]      [address]  [place]                                                Cristobal Goldmann Phone:  [phone]  Phone:  [phone]                 Phone:  [phone]  [place] [facility] Phone:  [phone]  [place] Child Abuse Hotline (336) 342-1394 [phone]) [phone] (After Hours)   

## 2011-11-24 NOTE — ED Provider Notes (Signed)
History     CSN: [id number]  Arrival date & time 11/23/11  2209   First MD Initiated Contact with Patient 11/24/11 0131      Chief Complaint  Patient presents with  . Abdominal Pain    (Consider location/radiation/quality/duration/timing/severity/associated sxs/prior treatment) HPI Comments: Acute onset of sharp and stabbing left lower cautery pain and left flank pain which started several days ago, has been intermittent until this evening when it became more constant, severe and associated with nausea. Nothing makes it better or worse, he cannot find a comfortable position, he denies hematuria or change in bowel habits. Denies fevers chills coughing shortness of breath. This feels different than a history of prior kidney stone on the right side. There been normal bowel movements, last of which was at 7:45 AM yesterday.  Patient is a 36 y.o. male presenting with abdominal pain. The history is provided by the patient.  Abdominal Pain The primary symptoms of the illness include abdominal pain and nausea. The primary symptoms of the illness do not include fever, fatigue, shortness of breath, vomiting, diarrhea, hematemesis, hematochezia or dysuria.    Past Medical History  Diagnosis Date  . Ear infection     History reviewed. No pertinent past surgical history.  No family history on file.  History  Substance Use Topics  . Smoking status: Current Everyday Smoker  . Smokeless tobacco: Not on file  . Alcohol Use: No      Review of Systems  Constitutional: Negative for fever and fatigue.  Respiratory: Negative for shortness of breath.   Gastrointestinal: Positive for nausea and abdominal pain. Negative for vomiting, diarrhea, hematochezia and hematemesis.  Genitourinary: Negative for dysuria.  All other systems reviewed and are negative.    Allergies  Penicillins  Home Medications   Current Outpatient Rx  Name Route Sig Dispense Refill  . NAPROXEN 500 MG PO TABS Oral  Take 1 tablet (500 mg total) by mouth 2 (two) times daily with a meal. 30 tablet 0    BP 124/73  Pulse 64  Temp(Src) 98.2 F (36.8 C) (Oral)  Resp 20  Ht 5\' 10"  (1.778 m)  Wt 250 lb (113.399 kg)  BMI 35.87 kg/m2  SpO2 100%  Physical Exam  Nursing note and vitals reviewed. Constitutional: He appears well-developed and well-nourished. No distress.  HENT:  Head: Normocephalic and atraumatic.  Mouth/Throat: Oropharynx is clear and moist. No oropharyngeal exudate.  Eyes: Conjunctivae and EOM are normal. Pupils are equal, round, and reactive to light. Right eye exhibits no discharge. Left eye exhibits no discharge. No scleral icterus.  Neck: Normal range of motion. Neck supple. No JVD present. No thyromegaly present.  Cardiovascular: Normal rate, regular rhythm, normal heart sounds and intact distal pulses.  Exam reveals no gallop and no friction rub.   No murmur heard. Pulmonary/Chest: Effort normal and breath sounds normal. No respiratory distress. He has no wheezes. He has no rales.  Abdominal: Soft. Bowel sounds are normal. He exhibits no distension and no mass. There is no tenderness.  Musculoskeletal: Normal range of motion. He exhibits no edema and no tenderness.  Lymphadenopathy:    He has no cervical adenopathy.  Neurological: He is alert. Coordination normal.  Skin: Skin is warm and dry. No rash noted. No erythema.  Psychiatric: He has a normal mood and affect. His behavior is normal.    ED Course  Procedures (including critical care time)  Labs Reviewed  URINALYSIS, ROUTINE W REFLEX MICROSCOPIC - Abnormal; Notable for the following:  Color, Urine AMBER (*) BIOCHEMICALS MAY BE AFFECTED BY COLOR   APPearance CLOUDY (*)    Hgb urine dipstick TRACE (*)    Urobilinogen, UA 2.0 (*)    All other components within normal limits  URINE MICROSCOPIC-ADD ON - Abnormal; Notable for the following:    Bacteria, UA MANY (*)    Crystals CA OXALATE CRYSTALS (*)    All other  components within normal limits  COMPREHENSIVE METABOLIC PANEL  CBC   Ct Abdomen Pelvis Wo Contrast  11/24/2011  *RADIOLOGY REPORT*  Clinical Data: Left upper quadrant pain.  Nausea and vomiting. Microhematuria.  CT ABDOMEN AND PELVIS WITHOUT CONTRAST  Technique:  Multidetector CT imaging of the abdomen and pelvis was performed following the standard protocol without intravenous contrast.  Comparison: 01/14/2011  Findings: The lung bases are clear.  There is a stone in the lower midportion of the right kidney measuring about 3 mm diameter.  No pyelocaliectasis on either side.  No left renal, ureteral, or bladder stones.  The unenhanced appearance of the liver, spleen, gallbladder, pancreas, adrenal glands, abdominal aorta, and retroperitoneal lymph nodes is unremarkable.  Small accessory spleens.  The stomach and small bowel are decompressed.  Stool filled colon without distension or wall thickening.  No free air or free fluid in the abdomen.  Pelvis:  The appendix is normal.  The bladder is decompressed. Calcification in the prostate gland.  No diverticulitis.  No free or loculated pelvic fluid collections.  No significant pelvic lymphadenopathy.  Normal alignment of the lumbar vertebrae with mild degenerative change.  IMPRESSION: 3 mm nonobstructing intrarenal stone on the right.  No pyelocaliectasis or ureterectasis.  No obstructing stones demonstrated.  Original Report Authenticated By: Marlon Pel, M.D.     1. Flank pain   2. Abdominal  pain, other specified site       MDM  The patient has a normal abdominal exam without tenderness or CVA tenderness. He appears to be in mild discomfort, has vital signs which are normal and a urinalysis showing slight hematuria. CT scan ordered for further evaluation of possible kidney stone versus other source, as he does have calcium oxylate crystals in the urine as well. Hydromorphone, Zofran, Toradol ordered, reevaluate.   Lab work is totally  normal, CT scan shows no acute findings, there is a kidney stone in the right kidney but this is not in the ureter. The patient has made multiple requests for narcotic medications, will hold narcotics, has normal vital signs and benign appearance, we'll discharge home to followup as outpatient. No narcotics given at discharge  Discharge Prescriptions include:  Naprosyn   Vida Roller, MD 11/24/11 [phone]

## 2020-09-06 ENCOUNTER — Emergency Department (HOSPITAL_COMMUNITY): Payer: Self-pay

## 2020-09-06 ENCOUNTER — Encounter (HOSPITAL_COMMUNITY): Payer: Self-pay

## 2020-09-06 ENCOUNTER — Emergency Department (HOSPITAL_COMMUNITY)
Admission: EM | Admit: 2020-09-06 | Discharge: 2020-09-06 | Disposition: A | Discharge status: Home / Self Care | Payer: Self-pay | Attending: Emergency Medicine | Admitting: Emergency Medicine

## 2020-09-06 DIAGNOSIS — R1031 Right lower quadrant pain: Secondary | ICD-10-CM | POA: Insufficient documentation

## 2020-09-06 DIAGNOSIS — F17200 Nicotine dependence, unspecified, uncomplicated: Secondary | ICD-10-CM | POA: Insufficient documentation

## 2020-09-06 DIAGNOSIS — R7989 Other specified abnormal findings of blood chemistry: Secondary | ICD-10-CM

## 2020-09-06 DIAGNOSIS — R748 Abnormal levels of other serum enzymes: Secondary | ICD-10-CM | POA: Insufficient documentation

## 2020-09-06 DIAGNOSIS — R112 Nausea with vomiting, unspecified: Secondary | ICD-10-CM | POA: Insufficient documentation

## 2020-09-06 DIAGNOSIS — R945 Abnormal results of liver function studies: Secondary | ICD-10-CM | POA: Insufficient documentation

## 2020-09-06 DIAGNOSIS — R319 Hematuria, unspecified: Secondary | ICD-10-CM | POA: Insufficient documentation

## 2020-09-06 LAB — URINALYSIS, ROUTINE W REFLEX MICROSCOPIC
Bilirubin Urine: NEGATIVE
Glucose, UA: NEGATIVE mg/dL
Ketones, ur: NEGATIVE mg/dL
Leukocytes,Ua: NEGATIVE
Nitrite: NEGATIVE
Protein, ur: NEGATIVE mg/dL
Specific Gravity, Urine: 1.015 (ref 1.005–1.030)
pH: 7 (ref 5.0–8.0)

## 2020-09-06 LAB — COMPREHENSIVE METABOLIC PANEL
ALT: 158 U/L — ABNORMAL HIGH (ref 0–44)
AST: 70 U/L — ABNORMAL HIGH (ref 15–41)
Albumin: 4.3 g/dL (ref 3.5–5.0)
Alkaline Phosphatase: 57 U/L (ref 38–126)
Anion gap: 5 (ref 5–15)
BUN: 12 mg/dL (ref 6–20)
CO2: 29 mmol/L (ref 22–32)
Calcium: 9.1 mg/dL (ref 8.9–10.3)
Chloride: 102 mmol/L (ref 98–111)
Creatinine, Ser: 0.74 mg/dL (ref 0.61–1.24)
GFR, Estimated: >60 mL/min (ref >60)
Glucose, Bld: 103 mg/dL — ABNORMAL HIGH (ref 70–99)
Potassium: 4 mmol/L (ref 3.5–5.1)
Sodium: 136 mmol/L (ref 135–145)
Total Bilirubin: 0.8 mg/dL (ref 0.3–1.2)
Total Protein: 8 g/dL (ref 6.5–8.1)

## 2020-09-06 LAB — CBC
HCT: 50.3 % (ref 39.0–52.0)
Hemoglobin: 16.6 g/dL (ref 13.0–17.0)
MCH: 29.3 pg (ref 26.0–34.0)
MCHC: 33 g/dL (ref 30.0–36.0)
MCV: 88.7 fL (ref 80.0–100.0)
Platelets: 195 10*3/uL (ref 150–400)
RBC: 5.67 MIL/uL (ref 4.22–5.81)
RDW: 13.7 % (ref 11.5–15.5)
WBC: 6.6 10*3/uL (ref 4.0–10.5)
nRBC: 0 % (ref 0.0–0.2)

## 2020-09-06 LAB — LIPASE, BLOOD: Lipase: 55 U/L — ABNORMAL HIGH (ref 11–51)

## 2020-09-06 MED ORDER — MORPHINE SULFATE (PF) 4 MG/ML IV SOLN
4.0000 mg | Freq: Once | INTRAVENOUS | Status: AC
Start: 2020-09-06 — End: 2020-09-06
  Administered 2020-09-06: 4 mg via INTRAVENOUS
  Filled 2020-09-06: qty 1

## 2020-09-06 MED ORDER — ONDANSETRON HCL 4 MG/2ML IJ SOLN
4.0000 mg | Freq: Once | INTRAMUSCULAR | Status: AC
  Administered 2020-09-06: 4 mg via INTRAVENOUS
  Filled 2020-09-06: qty 2

## 2020-09-06 MED ORDER — OMEPRAZOLE 20 MG PO CPDR: 20.0000 mg | DELAYED_RELEASE_CAPSULE | Freq: Every day | ORAL | 0 refills | Status: AC

## 2020-09-06 MED ORDER — IBUPROFEN 800 MG PO TABS: 800.0000 mg | ORAL_TABLET | Freq: Three times a day (TID) | ORAL | 0 refills | Status: AC

## 2020-09-06 MED ORDER — MORPHINE SULFATE (PF) 4 MG/ML IV SOLN
4.0000 mg | Freq: Once | INTRAVENOUS | Status: AC
  Administered 2020-09-06: 4 mg via INTRAVENOUS
  Filled 2020-09-06: qty 1

## 2020-09-06 MED ORDER — SODIUM CHLORIDE 0.9 % IV BOLUS
1000.0000 mL | Freq: Once | INTRAVENOUS | Status: AC
  Administered 2020-09-06: 1000 mL via INTRAVENOUS

## 2020-09-06 MED ORDER — IOHEXOL 300 MG/ML  SOLN
100.0000 mL | Freq: Once | INTRAMUSCULAR | Status: AC | PRN
  Administered 2020-09-06: 100 mL via INTRAVENOUS

## 2020-09-06 NOTE — ED Notes (Signed)
Patient back from CT.

## 2020-09-06 NOTE — ED Notes (Signed)
Unknown when patient left,  When this nurse entering the room to go over discharge instructions and give her his requested water, the room was empty.  IV was found in the trash can in the room.  Landis Gandy, [place] made aware.  Both this nurse and Landis Gandy, [place] tried to call patient and his spouse with no answer.

## 2020-09-06 NOTE — ED Triage Notes (Signed)
Pt presents with c/o RLQ abdominal pain down into his right groin. Pt has a hx of kidney stones, reports that this is what it normally feels like when he has a kidney stone. Pt reports vomiting in the parking lot prior to coming inside.

## 2020-09-06 NOTE — Discharge Instructions (Signed)
Your workup today was reassuring.  As we discussed your liver function panel was very mildly elevated.  Please follow-up with your primary care doctor to review this.  Your CT scan was reassuringly normal.  He did have blood in your urine I suspect that you are recently passed a stone.  Please use Tylenol or ibuprofen for pain.  You may use 800 mg ibuprofen every 6 hours or 1000 mg of Tylenol every 6 hours.  You may choose to alternate between the 2.  This would be most effective.  Not to exceed 4 g of Tylenol within 24 hours.  Not to exceed 3200 mg ibuprofen 24 hours.

## 2020-09-06 NOTE — ED Provider Notes (Signed)
[facility]-EMERGENCY DEPT Provider Note   CSN: [id number] Arrival date & time: 09/06/20  1241     History Chief Complaint  Patient presents with  . Abdominal Pain    Janetta HoraSteven Guyton is a 45 y.o. male.  HPI Patient is a 45 year old male with past medical history of frequent recurrent kidney stones.  He states that yesterday he began having some urinary frequency and dribbling which he states usually precedes his kidney stone pain.  He states that today he has had gradual onset of pain that seem to move from his right upper quadrant to his right lower quadrant.  He has had some hematuria and states that he has sharp stabbing pain in his right lower quadrant.  No history of surgeries.  He states no change in his bowel movements he states he had nausea and one episode of vomiting that was nonbloody nonbilious just prior to arriving in the ER but states that this resolved after nausea medication.  He denies any fevers chills.  Denies any history of IV drug use.  No history of hepatitis.      Past Medical History:  Diagnosis Date  . Ear infection     There are no problems to display for this patient.   History reviewed. No pertinent surgical history.     History reviewed. No pertinent family history.  Social History   Tobacco Use  . Smoking status: Current Every Day Smoker  Substance Use Topics  . Alcohol use: No    Home Medications Prior to Admission medications   Medication Sig Start Date End Date Taking? Authorizing Provider  ibuprofen (ADVIL) 800 MG tablet Take 1 tablet (800 mg total) by mouth 3 (three) times daily. 09/06/20  Yes Toddrick Sanna S, PA  omeprazole (PRILOSEC) 20 MG capsule Take 1 capsule (20 mg total) by mouth daily. 09/06/20  Yes Mattthew Ziomek, Stevphen MeuseWylder S, PA  tamsulosin (FLOMAX) 0.4 MG CAPS capsule Take 0.4 mg by mouth daily. 09/02/20  Yes [provider]  HYDROcodone-acetaminophen (NORCO/VICODIN) 5-325 MG tablet Take 1 tablet by  mouth every 6 (six) hours as needed for severe pain or moderate pain. 09/03/20   [provider]    Allergies    Ketorolac and Penicillins  Review of Systems   Review of Systems  Constitutional: Negative for chills and fever.  HENT: Negative for congestion.   Eyes: Negative for pain.  Respiratory: Negative for cough and shortness of breath.   Cardiovascular: Negative for chest pain and leg swelling.  Gastrointestinal: Positive for abdominal pain, nausea and vomiting.  Genitourinary: Positive for difficulty urinating and hematuria. Negative for dysuria, penile discharge, penile swelling and scrotal swelling.  Musculoskeletal: Negative for myalgias.  Skin: Negative for rash.  Neurological: Negative for dizziness and headaches.    Physical Exam Updated Vital Signs BP 125/84   Pulse 60   Temp 98.5 F (36.9 C) (Oral)   Resp 16   Ht 5\' 10"  (1.778 m)   Wt 122.5 kg   SpO2 96%   BMI 38.74 kg/m   Physical Exam Vitals and nursing note reviewed.  Constitutional:      General: He is not in acute distress.    Comments: Pleasant well-appearing 45 year old. Uncomfortable. Able answer questions appropriately follow commands. No increased work of breathing. Speaking in full sentences.  HENT:     Head: Normocephalic and atraumatic.     Nose: Nose normal.     Mouth/Throat:     Mouth: Mucous membranes are dry.  Eyes:  General: No scleral icterus. Cardiovascular:     Rate and Rhythm: Normal rate and regular rhythm.     Pulses: Normal pulses.     Heart sounds: Normal heart sounds.  Pulmonary:     Effort: Pulmonary effort is normal. No respiratory distress.     Breath sounds: No wheezing.  Abdominal:     Palpations: Abdomen is soft.     Tenderness: There is abdominal tenderness. There is no right CVA tenderness, left CVA tenderness, guarding or rebound.     Hernia: No hernia is present.     Comments: Very mild tenderness to palpation of the right upper quadrant with no  guarding and negative Murphy sign.  Some tenderness to palpation of the right lower quadrant of the abdomen.  Musculoskeletal:     Cervical back: Normal range of motion.     Right lower leg: No edema.     Left lower leg: No edema.  Skin:    General: Skin is warm and dry.     Capillary Refill: Capillary refill takes less than 2 seconds.  Neurological:     Mental Status: He is alert. Mental status is at baseline.  Psychiatric:        Mood and Affect: Mood normal.        Behavior: Behavior normal.     ED Results / Procedures / Treatments   Labs (all labs ordered are listed, but only abnormal results are displayed) Labs Reviewed  LIPASE, BLOOD - Abnormal; Notable for the following components:      Result Value   Lipase 55 (*)    All other components within normal limits  COMPREHENSIVE METABOLIC PANEL - Abnormal; Notable for the following components:   Glucose, Bld 103 (*)    AST 70 (*)    ALT 158 (*)    All other components within normal limits  URINALYSIS, ROUTINE W REFLEX MICROSCOPIC - Abnormal; Notable for the following components:   Hgb urine dipstick MODERATE (*)    Bacteria, UA RARE (*)    All other components within normal limits  CBC    EKG None  Radiology CT ABDOMEN PELVIS W CONTRAST  Result Date: 09/06/2020 CLINICAL DATA:  Right-sided abdominal pain. History of kidney stones. EXAM: CT ABDOMEN AND PELVIS WITH CONTRAST TECHNIQUE: Multidetector CT imaging of the abdomen and pelvis was performed using the standard protocol following bolus administration of intravenous contrast. CONTRAST:  OMNIPAQUE IOHEXOL 300 MG/ML  SOLN COMPARISON:  CT abdomen pelvis dated August 14, 2020. FINDINGS: Lower chest: No acute abnormality. Hepatobiliary: No focal liver abnormality is seen. No gallstones, gallbladder wall thickening, or biliary dilatation. Pancreas: Unremarkable. No pancreatic ductal dilatation or surrounding inflammatory changes. Spleen: Normal in size without focal  abnormality. Adrenals/Urinary Tract: Adrenal glands are unremarkable. Kidneys are normal, without renal calculi, focal lesion, or hydronephrosis. Bladder is unremarkable. Stomach/Bowel: Stomach is within normal limits. Appendix appears normal. No evidence of bowel wall thickening, distention, or inflammatory changes. Vascular/Lymphatic: No significant vascular findings are present. No enlarged abdominal or pelvic lymph nodes. Reproductive: Prostate is unremarkable. Other: Unchanged small fat containing right inguinal hernia. No free fluid or pneumoperitoneum. Musculoskeletal: No acute or significant osseous findings. IMPRESSION: 1. No acute intra-abdominal process. Electronically Signed   By: Obie Dredge M.D.   On: 09/06/2020 16:51    Procedures Procedures   Medications Ordered in ED Medications  sodium chloride 0.9 % bolus 1,000 mL (0 mLs Intravenous Stopped 09/06/20 1613)  morphine 4 MG/ML injection 4 mg (4 mg  Intravenous Given 09/06/20 1429)  ondansetron (ZOFRAN) injection 4 mg (4 mg Intravenous Given 09/06/20 1429)  iohexol (OMNIPAQUE) 300 MG/ML solution 100 mL (100 mLs Intravenous Contrast Given 09/06/20 1615)  morphine 4 MG/ML injection 4 mg (4 mg Intravenous Given 09/06/20 1630)    ED Course  I have reviewed the triage vital signs and the nursing notes.  Pertinent labs & imaging results that were available during my care of the patient were reviewed by me and considered in my medical decision making (see chart for details).  Patient is a 45 year old male with a past medical history significant for frequent ureteral stones.  I reviewed the EMR and it appears that he has had several in the past 2 years that I am able to review.  Patient states that he has pain very consistent with a ureteral stone.  He states that it began yesterday he had an episode of nausea and one episode of emesis today.  He states he is otherwise had no nausea or vomiting.  Denies any fevers or chills.  He endorses  some difficulty of urinating he states he has had decreased urination yesterday and this morning however he states that this seems to resolve in the past hour.  Physical exam patient has mild diffuse generalized abdominal pain with palpation specifically deep palpation.  Does have more tenderness over the right lower quadrant of his abdomen.  Patient has CMP with ALT and AST elevation ALT greater than AST and patient is an alcohol user.  He denies any recent significant Tylenol use and no recent antibiotics.  He denies any history of IV drug use.  No history of hepatitis.  He denies any diarrhea or fevers. CBC without leukocytosis or anemia.  Lipase mildly elevated.  Urinalysis with moderate hemoglobin.  Overall I feel that his work-up is more consistent with a ureteral stone which may have recently passed given his difficulty urinating has resolved.  Some consideration given to choledocholithiasis and cholecystitis given his somewhat generalized pain although he states that his tenderness is most pronounced in his right lower quadrant.  Given this question will obtain CT with contrast imaging to better evaluate for biliary disease and renal stone study.  Clinical Course as of 09/06/20 Nadara Eaton Sep 06, 2020  1717 CT scan without any appendicitis or biliary distention, or any other acute abnormality.  Hepatobiliary: No focal liver abnormality is seen. No gallstones, gallbladder wall thickening, or biliary dilatation.   Suspect recently passed kidney stone versus small kidney stone is difficult to see. [WF]    Clinical Course User Index [WF] Gailen Shelter, [place]   MDM Rules/Calculators/A&P                          Patient reassessed after fluids analgesia and nausea medicine.  He feels much improved.  He is agreeable with plan to discharge with Tylenol and ibuprofen and follow-up with urology and PCP.  He will specifically discuss his elevated LFTs and serum lipase.  Given the  cholecystitis, call your doctor lithiasis, pancreatitis or less likely will provide with omeprazole for possible gastritis.  I discussed this case with my attending physician who cosigned this note including patient's presenting symptoms, physical exam, and planned diagnostics and interventions. Attending physician stated agreement with plan or made changes to plan which were implemented.   Final Clinical Impression(s) / ED Diagnoses Final diagnoses:  Right lower quadrant abdominal pain  LFTs abnormal  Serum lipase elevation  Rx / DC Orders    Gailen Shelter, [place] 09/06/20 1842    Derwood Kaplan, MD 09/06/20 1931

## 2020-09-06 NOTE — ED Notes (Signed)
Patient transported to CT
